# Patient Record
Sex: Female | Born: 1965
Health system: Southern US, Community
[De-identification: ages and names within clinical notes are randomized; demographics above are authoritative.]

## PROBLEM LIST (undated history)

## (undated) DIAGNOSIS — K219 Gastro-esophageal reflux disease without esophagitis: Secondary | ICD-10-CM

## (undated) DIAGNOSIS — J309 Allergic rhinitis, unspecified: Secondary | ICD-10-CM

## (undated) HISTORY — DX: Gastro-esophageal reflux disease without esophagitis: K21.9

---

## 1998-10-22 ENCOUNTER — Other Ambulatory Visit: Admission: RE | Admit: 1998-10-22 | Discharge: 1998-10-22 | Payer: Self-pay | Admitting: Obstetrics and Gynecology

## 2001-04-03 ENCOUNTER — Other Ambulatory Visit: Admission: RE | Admit: 2001-04-03 | Discharge: 2001-04-03 | Payer: Self-pay | Admitting: *Deleted

## 2001-08-22 ENCOUNTER — Encounter: Admission: RE | Admit: 2001-08-22 | Discharge: 2001-08-22 | Payer: Self-pay | Admitting: Obstetrics and Gynecology

## 2001-08-22 ENCOUNTER — Encounter: Payer: Self-pay | Admitting: Obstetrics and Gynecology

## 2002-09-30 ENCOUNTER — Other Ambulatory Visit: Admission: RE | Admit: 2002-09-30 | Discharge: 2002-09-30 | Payer: Self-pay | Admitting: Obstetrics and Gynecology

## 2006-03-28 ENCOUNTER — Encounter: Admission: RE | Admit: 2006-03-28 | Discharge: 2006-03-28 | Payer: Self-pay | Admitting: Obstetrics and Gynecology

## 2007-12-23 ENCOUNTER — Ambulatory Visit (HOSPITAL_COMMUNITY): Admission: RE | Admit: 2007-12-23 | Discharge: 2007-12-23 | Payer: Self-pay | Admitting: Obstetrics and Gynecology

## 2008-04-03 ENCOUNTER — Encounter: Admission: RE | Admit: 2008-04-03 | Discharge: 2008-04-03 | Payer: Self-pay | Admitting: Obstetrics and Gynecology

## 2010-07-03 ENCOUNTER — Encounter: Payer: Self-pay | Admitting: Obstetrics and Gynecology

## 2010-08-19 ENCOUNTER — Other Ambulatory Visit: Payer: Self-pay | Admitting: Obstetrics and Gynecology

## 2010-08-19 DIAGNOSIS — Z1231 Encounter for screening mammogram for malignant neoplasm of breast: Secondary | ICD-10-CM

## 2010-09-19 ENCOUNTER — Ambulatory Visit: Payer: Self-pay

## 2010-10-07 ENCOUNTER — Ambulatory Visit
Admission: RE | Admit: 2010-10-07 | Discharge: 2010-10-07 | Disposition: A | Discharge status: Home / Self Care | Payer: 59 | Source: Ambulatory Visit | Attending: Obstetrics and Gynecology | Admitting: Obstetrics and Gynecology

## 2010-10-07 DIAGNOSIS — Z1231 Encounter for screening mammogram for malignant neoplasm of breast: Secondary | ICD-10-CM

## 2013-06-09 ENCOUNTER — Emergency Department (HOSPITAL_COMMUNITY): Payer: 59

## 2013-06-09 ENCOUNTER — Encounter (HOSPITAL_COMMUNITY): Payer: Self-pay | Admitting: Emergency Medicine

## 2013-06-09 ENCOUNTER — Emergency Department (HOSPITAL_COMMUNITY)
Admission: EM | Admit: 2013-06-09 | Discharge: 2013-06-09 | Disposition: A | Discharge status: Home / Self Care | Payer: 59 | Attending: Emergency Medicine | Admitting: Emergency Medicine

## 2013-06-09 DIAGNOSIS — H5710 Ocular pain, unspecified eye: Secondary | ICD-10-CM | POA: Insufficient documentation

## 2013-06-09 DIAGNOSIS — R51 Headache: Secondary | ICD-10-CM | POA: Insufficient documentation

## 2013-06-09 DIAGNOSIS — Z8709 Personal history of other diseases of the respiratory system: Secondary | ICD-10-CM | POA: Insufficient documentation

## 2013-06-09 DIAGNOSIS — R1110 Vomiting, unspecified: Secondary | ICD-10-CM | POA: Insufficient documentation

## 2013-06-09 DIAGNOSIS — R071 Chest pain on breathing: Secondary | ICD-10-CM | POA: Insufficient documentation

## 2013-06-09 DIAGNOSIS — J069 Acute upper respiratory infection, unspecified: Secondary | ICD-10-CM | POA: Insufficient documentation

## 2013-06-09 DIAGNOSIS — B349 Viral infection, unspecified: Secondary | ICD-10-CM

## 2013-06-09 DIAGNOSIS — B9789 Other viral agents as the cause of diseases classified elsewhere: Secondary | ICD-10-CM | POA: Insufficient documentation

## 2013-06-09 HISTORY — DX: Allergic rhinitis, unspecified: J30.9

## 2013-06-09 LAB — RAPID STREP SCREEN (MED CTR MEBANE ONLY): Streptococcus, Group A Screen (Direct): NEGATIVE

## 2013-06-09 MED ORDER — GUAIFENESIN 100 MG/5ML PO LIQD: 100.0000 mg | ORAL | Status: DC | PRN

## 2013-06-09 MED ORDER — GUAIFENESIN 100 MG/5ML PO SOLN
5.0000 mL | Freq: Once | ORAL | Status: AC
  Administered 2013-06-09: 100 mg via ORAL
  Filled 2013-06-09 (×2): qty 5

## 2013-06-09 MED ORDER — PENICILLIN V POTASSIUM 500 MG PO TABS: 500.0000 mg | ORAL_TABLET | Freq: Two times a day (BID) | ORAL | Status: DC

## 2013-06-09 NOTE — ED Notes (Signed)
Pt states she had onset of productive cough with green sputum 2 weeks ago.  Pt has taken Amoxicillin and Tessalon pearls.  Pt states cough settled and has gotten progressively worse over the last 2 days.  Sputum is now clear.  No fever.

## 2013-06-09 NOTE — ED Provider Notes (Signed)
CSN: [id number]     Arrival date & time 06/09/13  1638 History  This chart was scribed for non-physician practitioner Raymon Mutton, PA-C, working with Doug Sou, MD, by Yevette Edwards, ED Scribe. This patient was seen in room TR07C/TR07C and the patient's care was started at 8:54 PM  First MD Initiated Contact with Patient 06/09/13 2002     Chief Complaint  Patient presents with  . Cough  . Sore Throat  . Headache   HPI HPI Comments: Christie Werner is a 47 y.o. female who presents to the Emergency Department complaining of a cough which began two weeks ago, improved, and then returned today. Her cough was productive of green sputum, and she treated her symptoms with Augmentin and tessalon pearls with improvement. She had used the tessalon pearls for 11 days with improvement until yesterday when the cough returned and increased in severity. The recurrent cough is productive of clear sputum and has been constant. She has post-tussive chest pain, increased pain with deep inspiration, one episode of emesis, eye pain, mild post-nasal drip, and a headache. She denies difficulty swallowing, neck pain, a fever, myalgia, blurred vision, otalgia, blurred vision, or hematemesis. She is a non-smoker.   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    Past Medical History  Diagnosis Date  . Allergic rhinitis    Past Surgical History  Procedure Laterality Date  . Cesarean section     No family history on file. History  Substance Use Topics  . Smoking status: Never Smoker   . Smokeless tobacco: Not on file  . Alcohol Use: No   No OB history provided.  Review of Systems  Constitutional: Negative for fever.  HENT: Positive for postnasal drip and sore throat. Negative for ear pain and trouble swallowing.   Eyes: Positive for pain. Negative for visual disturbance.  Respiratory: Positive for cough.   Cardiovascular: Positive for chest pain.  Gastrointestinal: Positive for  vomiting.  Neurological: Positive for headaches.  All other systems reviewed and are negative.   Allergies  Review of patient's allergies indicates no known allergies.  Home Medications   Current Outpatient Rx  Name  Route  Sig  Dispense  Refill  . amoxicillin-clavulanate (AUGMENTIN) 500-125 MG per tablet   Oral   Take 1 tablet by mouth 2 (two) times daily. For 14 days. Started on 05-29-13. Pt's on day 11 of therapy         . benzonatate (TESSALON) 100 MG capsule   Oral   Take 100 mg by mouth 3 (three) times daily as needed for cough.         Marland Kitchen ibuprofen (ADVIL,MOTRIN) 100 MG tablet   Oral   Take 100 mg by mouth every 6 (six) hours as needed for pain or fever.         Marland Kitchen guaiFENesin (ROBITUSSIN) 100 MG/5ML liquid   Oral   Take 5 mLs (100 mg total) by mouth every 4 (four) hours as needed for cough.   120 mL   0    Triage Vitals: BP 143/88  Pulse 105  Temp(Src) 98.2 F (36.8 C) (Oral)  Resp 15  SpO2 99%  LMP 05/10/2013  Physical Exam  Nursing note and vitals reviewed. Constitutional: She is oriented to person, place, and time. She appears well-developed and well-nourished. No distress.  HENT:  Head: Normocephalic and atraumatic.  Right Ear: External ear normal.  Left Ear: External ear normal.  Mild discomfort upon palpation to the  frontal and maxillary sinuses bilaterally Mild swelling localized to the bilateral tonsils with positive exudate mild erythema. Negative swelling, exudate, erythema, petechiae noted to the posterior oropharynx. Negative postnasal drip.  Eyes: Conjunctivae and EOM are normal. Pupils are equal, round, and reactive to light. Right eye exhibits no discharge. Left eye exhibits no discharge.  Neck: Normal range of motion. Neck supple. No tracheal deviation present.  Negative neck stiffness Negative nuchal rigidity Negative cervical lymphadenopathy  Cardiovascular: Normal rate, regular rhythm and normal heart sounds.  Exam reveals no  friction rub.   No murmur heard. Pulses:      Radial pulses are 2+ on the right side, and 2+ on the left side.  Pulmonary/Chest: Effort normal and breath sounds normal. No respiratory distress. She has no wheezes. She has no rales. She exhibits no tenderness.  Musculoskeletal: Normal range of motion. She exhibits no tenderness.  Full ROM to upper and lower extremities without difficulty noted, negative ataxia noted  Lymphadenopathy:    She has no cervical adenopathy.  Neurological: She is alert and oriented to person, place, and time. She exhibits normal muscle tone. Coordination normal.  Skin: Skin is warm and dry.  Psychiatric: She has a normal mood and affect. Her behavior is normal. Thought content normal.    ED Course  Procedures (including critical care time)  DIAGNOSTIC STUDIES: Oxygen Saturation is 99% on room air, normal by my interpretation.    COORDINATION OF CARE:  9:01 PM- Discussed treatment plan with patient, and the patient agreed to the plan.   Results for orders placed during the hospital encounter of 06/09/13  RAPID STREP SCREEN      Result Value Range   Streptococcus, Group A Screen (Direct) NEGATIVE  NEGATIVE  CULTURE, GROUP A STREP      Result Value Range   Specimen Description THROAT     Special Requests NONE     Culture       Value: No Beta Hemolytic Streptococci Isolated     Performed at [facility]   Report Status 06/11/2013 FINAL     Dg Chest 2 View  06/09/2013   CLINICAL DATA:  Cough, sore throat  EXAM: CHEST  2 VIEW  COMPARISON:  12/23/2007  FINDINGS: There is no focal parenchymal opacity, pleural effusion, or pneumothorax. The heart and mediastinal contours are unremarkable.  There is mild thoracic spine spondylosis.  IMPRESSION: No active cardiopulmonary disease.   Electronically Signed   By: Elige Ko   On: 06/09/2013 18:25    Labs Review Labs Reviewed  RAPID STREP SCREEN  CULTURE, GROUP A STREP   Imaging Review Dg Chest 2  View  06/09/2013   CLINICAL DATA:  Cough, sore throat  EXAM: CHEST  2 VIEW  COMPARISON:  12/23/2007  FINDINGS: There is no focal parenchymal opacity, pleural effusion, or pneumothorax. The heart and mediastinal contours are unremarkable.  There is mild thoracic spine spondylosis.  IMPRESSION: No active cardiopulmonary disease.   Electronically Signed   By: Elige Ko   On: 06/09/2013 18:25    EKG Interpretation   None       MDM   1. URI, acute   2. Viral illness    Medications  guaiFENesin (ROBITUSSIN) 100 MG/5ML solution 100 mg (100 mg Oral Given 06/09/13 2154)    Filed Vitals:   06/09/13 1652  BP: 143/88  Pulse: 105  Temp: 98.2 F (36.8 C)  TempSrc: Oral  Resp: 15  SpO2: 99%   I personally performed  the services described in this documentation, which was scribed in my presence. The recorded information has been reviewed and is accurate.  Patient presenting to emergency department with nasal congestion, productive cough, sinus pressure that is been ongoing for the past 2 weeks. Patient reports that she prescribed for self Augmentin and Tessalon Perles. Patient reported within the past 48 hours she is worsening cough, more dry than productive. Reported that she is on a 14 day course of Augmenting is on her ninth day.  Alert and oriented. GCS 15. Heart rate and rhythm normal. Pulses palpable strong, radial 2+ bilaterally. Lungs clear to auscultation bilaterally. Mild discomfort upon palpation to the frontal and maxillary sinuses bilaterally. Negative postnasal drip. Exudate noted to tonsils bilaterally with mild erythema. Negative erythema, swelling, petechiae or exudate noted to the posterior oropharynx. Negative trismus. Uvula midline, symmetrical elevation-negative for uvula swelling. Negative neck stiffness, negative nuchal rigidity. Full range of motion to upper and lower extremities bilaterally. Chest x-ray negative for acute cardiac pulmonary disease. Rapid strep test  negative. Doubt strep pharyngitis. Doubt meningitis. Doubt pneumonia. Suspicion to be viral illness. Patient stable, afebrile. Discharged patient. Discussed with patient to continue to take Augmentin as prescribed. Discussed with patient to take Robitussin. Referred to PCP. Discussed with patient to closely monitor symptoms and if symptoms are to worsen or change to report back to the ED - strict return instructions given.  Patient agreed to plan of care, understood, all questions answered.    Raymon Mutton, PA-C 06/11/13 1312

## 2013-06-11 LAB — CULTURE, GROUP A STREP

## 2013-06-11 NOTE — ED Provider Notes (Signed)
Medical screening examination/treatment/procedure(s) were performed by non-physician practitioner and as supervising physician I was immediately available for consultation/collaboration.  EKG Interpretation   None        Doug Sou, MD 06/11/13 2034

## 2013-06-27 ENCOUNTER — Ambulatory Visit (INDEPENDENT_AMBULATORY_CARE_PROVIDER_SITE_OTHER): Payer: 59 | Admitting: Emergency Medicine

## 2013-06-27 ENCOUNTER — Ambulatory Visit: Payer: 59

## 2013-06-27 VITALS — BP 118/74 | HR 92 | Temp 99.2°F | Resp 18 | Ht 64.5 in | Wt 173.0 lb

## 2013-06-27 DIAGNOSIS — R059 Cough, unspecified: Secondary | ICD-10-CM

## 2013-06-27 DIAGNOSIS — J209 Acute bronchitis, unspecified: Secondary | ICD-10-CM

## 2013-06-27 DIAGNOSIS — J029 Acute pharyngitis, unspecified: Secondary | ICD-10-CM

## 2013-06-27 DIAGNOSIS — R05 Cough: Secondary | ICD-10-CM

## 2013-06-27 DIAGNOSIS — J040 Acute laryngitis: Secondary | ICD-10-CM

## 2013-06-27 LAB — POCT RAPID STREP A (OFFICE): RAPID STREP A SCREEN: NEGATIVE

## 2013-06-27 MED ORDER — ALBUTEROL SULFATE HFA 108 (90 BASE) MCG/ACT IN AERS: 2.0000 | INHALATION_SPRAY | RESPIRATORY_TRACT | Status: DC | PRN

## 2013-06-27 MED ORDER — HYDROCOD POLST-CHLORPHEN POLST 10-8 MG/5ML PO LQCR: 5.0000 mL | Freq: Two times a day (BID) | ORAL | Status: DC | PRN

## 2013-06-27 NOTE — Patient Instructions (Signed)

## 2013-06-27 NOTE — Progress Notes (Signed)
[facility] [address], [place] [address] [phone]  Date:  06/27/2013   Name:  Christie Werner   DOB:  29-Jan-1966   MRN:  [id number]  PCP:  Alva Garnet., MD    Chief Complaint: Cough, Laryngitis and Sore Throat   History of Present Illness:  Christie Werner is a 48 y.o. very pleasant female patient who presents with the following:  Lengthy history of illness for three weeks.  Initially had sinusitis and bronchitis, treated with augmentin.  She developed the flu on her last day of augmentin.  following four days of fever, she developed bronchospasm and started neb aerosols of albuterol with improvement both of her cough and wheezing.  she was seen at [facility] ER and discharged with a URI during this time.  Since her ER visit.  Since her recovery from the flu symptoms she has experienced a persistent cough that is not productive, a sore throat and a mucoid nasal discharge and post nasal drip and laryngitis.  She has no residual fever or chills or malaise.  No improvement with over the counter medications or other home remedies. Denies other complaint or health concern today.   There are no active problems to display for this patient.   Past Medical History  Diagnosis Date  . Allergic rhinitis   . GERD (gastroesophageal reflux disease)     Past Surgical History  Procedure Laterality Date  . Cesarean section      History  Substance Use Topics  . Smoking status: Never Smoker   . Smokeless tobacco: Not on file  . Alcohol Use: No    Family History  Problem Relation Age of Onset  . Diabetes Mother   . Hyperlipidemia Mother   . Hypertension Father   . Mental illness Sister   . Diabetes Maternal Grandmother   . Stroke Maternal Grandmother   . Diabetes Maternal Grandfather   . Diabetes Sister     No Known Allergies  Medication list has been reviewed and updated.  Current Outpatient Prescriptions on File Prior to Visit  Medication Sig Dispense  Refill  . amoxicillin-clavulanate (AUGMENTIN) 500-125 MG per tablet Take 1 tablet by mouth 2 (two) times daily. For 14 days. Started on 05-29-13. Pt's on day 11 of therapy      . benzonatate (TESSALON) 100 MG capsule Take 100 mg by mouth 3 (three) times daily as needed for cough.      Marland Kitchen guaiFENesin (ROBITUSSIN) 100 MG/5ML liquid Take 5 mLs (100 mg total) by mouth every 4 (four) hours as needed for cough.  120 mL  0  . ibuprofen (ADVIL,MOTRIN) 100 MG tablet Take 100 mg by mouth every 6 (six) hours as needed for pain or fever.       No current facility-administered medications on file prior to visit.    Review of Systems:  As per HPI, otherwise negative.   Physical Examination: Filed Vitals:   06/27/13 1259  BP: 118/74  Pulse: 92  Temp: 99.2 F (37.3 C)  Resp: 18   Filed Vitals:   06/27/13 1259  Height: 5' 4.5" (1.638 m)  Weight: 173 lb (78.472 kg)   Body mass index is 29.25 kg/(m^2). Ideal Body Weight: Weight in (lb) to have BMI = 25: 147.6  GEN: WDWN, NAD, Non-toxic, A & O x 3 hoarse. HEENT: Atraumatic, Normocephalic. Neck supple. No masses, No LAD. Ears and Nose: No external deformity. CV: RRR, No M/G/R. No JVD. No thrill. No extra heart  sounds. PULM: CTA B, no wheezes, crackles, rhonchi. No retractions. No resp. distress. No accessory muscle use. ABD: S, NT, ND, +BS. No rebound. No HSM. EXTR: No c/c/e NEURO Normal gait.  PSYCH: Normally interactive. Conversant. Not depressed or anxious appearing.  Calm demeanor.    Assessment and Plan: Laryngitis Bronchitis Albuterol MDI tussionex  Signed,  Phillips OdorJeffery Anderson, MD   Results for orders placed in visit on 06/27/13  POCT RAPID STREP A (OFFICE)      Result Value Range   Rapid Strep A Screen Negative  Negative   [facility] reading (PRIMARY) by  Dr. Dareen PianoAnderson.  Negative chest..

## 2013-07-07 LAB — CULTURE, GROUP A STREP: ORGANISM ID, BACTERIA: NORMAL

## 2013-10-06 ENCOUNTER — Ambulatory Visit (INDEPENDENT_AMBULATORY_CARE_PROVIDER_SITE_OTHER): Payer: 59 | Admitting: Family Medicine

## 2013-10-06 ENCOUNTER — Encounter: Payer: Self-pay | Admitting: Family Medicine

## 2013-10-06 VITALS — BP 112/80 | HR 71 | Temp 98.7°F | Resp 16 | Ht 64.5 in | Wt 172.0 lb

## 2013-10-06 DIAGNOSIS — Z7689 Persons encountering health services in other specified circumstances: Secondary | ICD-10-CM

## 2013-10-06 DIAGNOSIS — Z7189 Other specified counseling: Secondary | ICD-10-CM

## 2013-10-06 DIAGNOSIS — J309 Allergic rhinitis, unspecified: Secondary | ICD-10-CM

## 2013-10-06 NOTE — Patient Instructions (Signed)
Good to see you today! Come and see me this fall for a physical exam

## 2013-10-06 NOTE — Progress Notes (Signed)
[facility] [address], [place] [address] [phone]  Date:  10/06/2013   Name:  Christie Werner   DOB:  08/29/1965   MRN:  [id number]  PCP:  Alva GarnetSHELTON,KIMBERLY R., MD    Chief Complaint: Establish Care   History of Present Illness:  Christie Stacknger Delong is a 48 y.o. very pleasant female patient who presents with the following:  She is here to establish care today.  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   She is generally healthy.   She is "finally" done with wheezing.  She has some occasional sneezing, but nothing major.  Sometimes her ears will pop.  She uses an OTC allergy med sometimes but not always.  Does not desire a nasal spray.  She has occasional reflux with dietary indiscretion.   She still has menses.  She has one son- he is 49 years old.    There are no active problems to display for this patient.   Past Medical History  Diagnosis Date  . Allergic rhinitis   . GERD (gastroesophageal reflux disease)     Past Surgical History  Procedure Laterality Date  . Cesarean section      History  Substance Use Topics  . Smoking status: Never Smoker   . Smokeless tobacco: Not on file  . Alcohol Use: No    Family History  Problem Relation Age of Onset  . Diabetes Mother   . Hyperlipidemia Mother   . Hypertension Father   . Mental illness Sister   . Diabetes Maternal Grandmother   . Stroke Maternal Grandmother   . Diabetes Maternal Grandfather   . Diabetes Sister     No Known Allergies  Medication list has been reviewed and updated.  Current Outpatient Prescriptions on File Prior to Visit  Medication Sig Dispense Refill  . albuterol (PROVENTIL HFA;VENTOLIN HFA) 108 (90 BASE) MCG/ACT inhaler Inhale 2 puffs into the lungs every 4 (four) hours as needed for wheezing or shortness of breath (cough, shortness of breath or wheezing.).  1 Inhaler  1  . beta carotene w/minerals (OCUVITE) tablet Take 1 tablet by mouth daily.      . Calcium  Carbonate (CALCIUM 600 PO) Take by mouth.      . Famotidine (PEPCID PO) Take by mouth.      . Multiple Vitamins-Minerals (CENTRUM PO) Take by mouth.       No current facility-administered medications on file prior to visit.    Review of Systems:  As per HPI- otherwise negative.   Physical Examination: Filed Vitals:   10/06/13 0941  BP: 138/88  Pulse: 71  Temp: 98.7 F (37.1 C)  Resp: 16   Filed Vitals:   10/06/13 0941  Height: 5' 4.5" (1.638 m)  Weight: 172 lb (78.019 kg)   Body mass index is 29.08 kg/(m^2). Ideal Body Weight: Weight in (lb) to have BMI = 25: 147.6  GEN: WDWN, NAD, Non-toxic, A & O x 3, overweight, looks well HEENT: Atraumatic, Normocephalic. Neck supple. No masses, No LAD.  Bilateral TM wnl, oropharynx normal.  PEERL,EOMI.   Ears and Nose: No external deformity. CV: RRR, No M/G/R. No JVD. No thrill. No extra heart sounds. PULM: CTA B, no wheezes, crackles, rhonchi. No retractions. No resp. distress. No accessory muscle use. EXTR: No c/c/e NEURO Normal gait.  PSYCH: Normally interactive. Conversant. Not depressed or anxious appearing.  Calm demeanor.    Assessment and Plan: Encounter to establish care  Healthy woman  here to establish care today.  Plan labs and a physical in the fall.  She is not fasting today so prefers not to do labs today  Signed Abbe AmsterdamJessica Ignatz Deis, MD

## 2013-12-19 ENCOUNTER — Ambulatory Visit (INDEPENDENT_AMBULATORY_CARE_PROVIDER_SITE_OTHER): Payer: 59 | Admitting: Emergency Medicine

## 2013-12-19 VITALS — BP 126/82 | HR 82 | Temp 98.2°F | Resp 16 | Ht 62.25 in | Wt 172.6 lb

## 2013-12-19 DIAGNOSIS — H8110 Benign paroxysmal vertigo, unspecified ear: Secondary | ICD-10-CM

## 2013-12-19 MED ORDER — MECLIZINE HCL 50 MG PO TABS: 50.0000 mg | ORAL_TABLET | Freq: Three times a day (TID) | ORAL | Status: DC | PRN

## 2013-12-19 NOTE — Progress Notes (Signed)
[facility] [address], [place] [address] [phone]  Date:  12/19/2013   Name:  Christie Werner   DOB:  07/27/1965   MRN:  [id number]  PCP:  Alva GarnetSHELTON,KIMBERLY R., MD    Chief Complaint: Dizziness, Emesis and Nausea   History of Present Illness:  Christie Stacknger Giuliani is a 48 y.o. very pleasant female patient who presents with the following:  Sudden onset of dizziness and intense nausea with one episode of vomiting.  No history of antecedent illness or head injury.  No history of palpitations or tachycardia. No chest pain, tightness or heaviness.  No neuro or visual symptoms.  Says dizziness is related to movement of her head.  No history of vertigo.  Non smoker, no HBP, HLD, DM.    There are no active problems to display for this patient.   Past Medical History  Diagnosis Date  . Allergic rhinitis   . GERD (gastroesophageal reflux disease)     Past Surgical History  Procedure Laterality Date  . Cesarean section      History  Substance Use Topics  . Smoking status: Never Smoker   . Smokeless tobacco: Not on file  . Alcohol Use: No    Family History  Problem Relation Age of Onset  . Diabetes Mother   . Hyperlipidemia Mother   . Hypertension Father   . Mental illness Sister   . Diabetes Maternal Grandmother   . Stroke Maternal Grandmother   . Diabetes Maternal Grandfather   . Diabetes Sister     No Known Allergies  Medication list has been reviewed and updated.  Current Outpatient Prescriptions on File Prior to Visit  Medication Sig Dispense Refill  . albuterol (PROVENTIL HFA;VENTOLIN HFA) 108 (90 BASE) MCG/ACT inhaler Inhale 2 puffs into the lungs every 4 (four) hours as needed for wheezing or shortness of breath (cough, shortness of breath or wheezing.).  1 Inhaler  1  . beta carotene w/minerals (OCUVITE) tablet Take 1 tablet by mouth daily.      . Calcium Carbonate (CALCIUM 600 PO) Take by mouth.      . Famotidine (PEPCID PO) Take by mouth.       . Multiple Vitamins-Minerals (CENTRUM PO) Take by mouth.       No current facility-administered medications on file prior to visit.    Review of Systems:  As per HPI, otherwise negative.    Physical Examination: Filed Vitals:   12/19/13 1557  BP: 126/82  Pulse: 82  Temp: 98.2 F (36.8 C)  Resp: 16   Filed Vitals:   12/19/13 1557  Height: 5' 2.25" (1.581 m)  Weight: 172 lb 9.6 oz (78.291 kg)   Body mass index is 31.32 kg/(m^2). Ideal Body Weight: Weight in (lb) to have BMI = 25: 137.5  GEN: WDWN, NAD, Non-toxic, A & O x 3 HEENT: Atraumatic, Normocephalic. Neck supple. No masses, No LAD. Ears and Nose: No external deformity. CV: RRR, No M/G/R. No JVD. No thrill. No extra heart sounds.  No bruit PULM: CTA B, no wheezes, crackles, rhonchi. No retractions. No resp. distress. No accessory muscle use. ABD: S, NT, ND, +BS. No rebound. No HSM. EXTR: No c/c/e NEURO Normal gait. PRRERLA EOMI CN 2-12 intact PSYCH: Normally interactive. Conversant. Not depressed or anxious appearing.  Calm demeanor.     Assessment and Plan: Benign positional vertigo antivert  ENT    Signed,  Phillips OdorJeffery Helayne Metsker, MD

## 2013-12-19 NOTE — Patient Instructions (Signed)
Benign Positional Vertigo Vertigo means you feel like you or your surroundings are moving when they are not. Benign positional vertigo is the most common form of vertigo. Benign means that the cause of your condition is not serious. Benign positional vertigo is more common in older adults. CAUSES  Benign positional vertigo is the result of an upset in the labyrinth system. This is an area in the middle ear that helps control your balance. This may be caused by a viral infection, head injury, or repetitive motion. However, often no specific cause is found. SYMPTOMS  Symptoms of benign positional vertigo occur when you move your head or eyes in different directions. Some of the symptoms may include:  Loss of balance and falls.  Vomiting.  Blurred vision.  Dizziness.  Nausea.  Involuntary eye movements (nystagmus). DIAGNOSIS  Benign positional vertigo is usually diagnosed by physical exam. If the specific cause of your benign positional vertigo is unknown, your caregiver may perform imaging tests, such as magnetic resonance imaging (MRI) or computed tomography (CT). TREATMENT  Your caregiver may recommend movements or procedures to correct the benign positional vertigo. Medicines such as meclizine, benzodiazepines, and medicines for nausea may be used to treat your symptoms. In rare cases, if your symptoms are caused by certain conditions that affect the inner ear, you may need surgery. HOME CARE INSTRUCTIONS   Follow your caregiver's instructions.  Move slowly. Do not make sudden body or head movements.  Avoid driving.  Avoid operating heavy machinery.  Avoid performing any tasks that would be dangerous to you or others during a vertigo episode.  Drink enough fluids to keep your urine clear or pale yellow. SEEK IMMEDIATE MEDICAL CARE IF:   You develop problems with walking, weakness, numbness, or using your arms, hands, or legs.  You have difficulty speaking.  You develop  severe headaches.  Your nausea or vomiting continues or gets worse.  You develop visual changes.  Your family or friends notice any behavioral changes.  Your condition gets worse.  You have a fever.  You develop a stiff neck or sensitivity to light. MAKE SURE YOU:   Understand these instructions.  Will watch your condition.  Will get help right away if you are not doing well or get worse. Document Released: 03/06/2006 Document Revised: 08/21/2011 Document Reviewed: 02/16/2011 Exit[employer] Patient Information 2015 [employer]. This information is not intended to replace advice given to you by your health care provider. Make sure you discuss any questions you have with your health care provider.    

## 2014-02-23 ENCOUNTER — Ambulatory Visit (INDEPENDENT_AMBULATORY_CARE_PROVIDER_SITE_OTHER): Payer: 59 | Admitting: Family Medicine

## 2014-02-23 ENCOUNTER — Encounter: Payer: Self-pay | Admitting: Family Medicine

## 2014-02-23 VITALS — BP 123/80 | Ht 64.0 in | Wt 175.0 lb

## 2014-02-23 DIAGNOSIS — M25519 Pain in unspecified shoulder: Secondary | ICD-10-CM

## 2014-02-23 DIAGNOSIS — M25511 Pain in right shoulder: Secondary | ICD-10-CM

## 2014-02-24 DIAGNOSIS — M25519 Pain in unspecified shoulder: Secondary | ICD-10-CM | POA: Insufficient documentation

## 2014-02-24 NOTE — Assessment & Plan Note (Signed)
Pain seems to be mostly of the deltoid so I suspect it's referred from either subacromial bursa or referred nerve pain. Can't really find a specific etiology. We'll start her on general shoulder rehabilitation program which we gave her in handout form explained. She'll followup in 2-4 weeks. We discussed possibly doing a corticosteroid injection she does not want to consider that today but my considered in the future. She recalls in the interim with new or worsening symptoms.

## 2014-02-24 NOTE — Progress Notes (Signed)
Patient ID: Christie Werner, female   DOB: Oct 10, 1965, 48 y.o.   MRN: [id number]  Christie Werner - 48 y.o. female MRN [id number]  Date of birth: 21-Mar-1966    SUBJECTIVE:     Right shoulder pain for the last 3-4 weeks. Pain is located under the deltoid muscle. Extremely painful at night when she tries to while met side. Also has pain with arm elevation. She is right-hand dominant. She recalls no specific injury, just awoke one morning with pain there. No news activities. ROS:     No numbness or tingling in her right upper extremity. As noted no warmth or erythema or swelling of the right shoulder joint. No fever, sweats, chills.  PERTINENT  PMH / PSH FH / / SH:  Past Medical, Surgical, Social, and Family History Reviewed & Updated in the EMR.  Pertinent findings include:  No prior history of shoulder surgery or injury.  OBJECTIVE: BP 123/80  Ht _0  (1.626 m)  Wt 175 lb (79.379 kg)  BMI 30.02 kg/m2  Physical Exam:  Vital signs are reviewed. GENERAL: Well-developed female no acute distress Shoulder: Right. Full range of motion in all planes the rotator cuff. Shoulder abduction causes some increased pain in the area of the deltoid muscle. She does not really have any significant impingement signs. Distally she is neurovascularly intact. The muscle bulk of the right and left deltoid is symmetrical. The a.c. joint is non-tender to palpation. ULTRASOUND: Supraspinatus and subscapularis muscle are seen in entirety and reveal no calcifications, no tears, no edema, no increased Doppler activity. The a.c. joint is seen and appears normal without any sign of excessive arthritic change, no effusion. The glenoid/labrum is not seen well secondary to habitus.  ASSESSMENT & PLAN:  See problem based charting & AVS for pt instructions.

## 2014-03-13 ENCOUNTER — Ambulatory Visit: Payer: 59 | Admitting: Family Medicine

## 2015-08-06 DIAGNOSIS — H524 Presbyopia: Secondary | ICD-10-CM | POA: Diagnosis not present

## 2017-01-11 DIAGNOSIS — H5211 Myopia, right eye: Secondary | ICD-10-CM | POA: Diagnosis not present

## 2017-01-11 DIAGNOSIS — H52203 Unspecified astigmatism, bilateral: Secondary | ICD-10-CM | POA: Diagnosis not present

## 2017-01-11 DIAGNOSIS — H5202 Hypermetropia, left eye: Secondary | ICD-10-CM | POA: Diagnosis not present

## 2017-01-11 DIAGNOSIS — H524 Presbyopia: Secondary | ICD-10-CM | POA: Diagnosis not present

## 2018-01-21 MED FILL — CEPHALEXIN 500 MG CAPSULE: 500 | 10 days supply | Qty: 20 | Fill #0

## 2018-01-21 MED FILL — FLUCONAZOLE 150 MG TABS: 150 | 1 days supply | Qty: 1 | Fill #0

## 2018-05-07 DIAGNOSIS — H524 Presbyopia: Secondary | ICD-10-CM | POA: Diagnosis not present

## 2018-05-07 DIAGNOSIS — H5202 Hypermetropia, left eye: Secondary | ICD-10-CM | POA: Diagnosis not present

## 2018-05-07 DIAGNOSIS — H5211 Myopia, right eye: Secondary | ICD-10-CM | POA: Diagnosis not present

## 2018-07-14 DIAGNOSIS — J189 Pneumonia, unspecified organism: Secondary | ICD-10-CM | POA: Diagnosis not present

## 2018-07-14 DIAGNOSIS — J101 Influenza due to other identified influenza virus with other respiratory manifestations: Secondary | ICD-10-CM | POA: Diagnosis not present

## 2019-04-09 ENCOUNTER — Other Ambulatory Visit: Payer: Self-pay

## 2019-04-09 ENCOUNTER — Other Ambulatory Visit: Payer: Self-pay | Admitting: Obstetrics and Gynecology

## 2019-04-09 ENCOUNTER — Encounter: Payer: Self-pay | Admitting: Sports Medicine

## 2019-04-09 ENCOUNTER — Ambulatory Visit: Payer: 59 | Admitting: Sports Medicine

## 2019-04-09 VITALS — BP 128/92 | Ht 64.0 in | Wt 175.0 lb

## 2019-04-09 DIAGNOSIS — M25511 Pain in right shoulder: Secondary | ICD-10-CM | POA: Diagnosis not present

## 2019-04-09 DIAGNOSIS — Z1231 Encounter for screening mammogram for malignant neoplasm of breast: Secondary | ICD-10-CM

## 2019-04-09 MED ORDER — METHYLPREDNISOLONE ACETATE 40 MG/ML IJ SUSP
40.0000 mg | Freq: Once | INTRAMUSCULAR | Status: AC
  Administered 2019-04-09: 11:00:00 40 mg via INTRA_ARTICULAR

## 2019-04-09 NOTE — Progress Notes (Addendum)
   [facility] [address] 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  She describes the pain is intermittent but has progressively gotten worse with overhead activity and reaching forward.  She had a similar injury about 5 years ago.  Will occasionally have clicking in the anterior shoulder.  Was no specific mechanism.  Over the last few weeks she has noted some numbness and weakness in her thumb and index finger.  She has had to use OTC anti-inflammatory 5-6 times for pain relief.  Has not tried HEP or PT.  She denies swelling, erythema, instability, or skin changes.     ROS:     See HPI.   PERTINENT  PMH / PSH / FH / SH:  Past Medical, Surgical, Social, and Family History Reviewed & Updated in the EMR. Pertinent findings include:  History of neck pain   OBJECTIVE:  BP (!) 128/92   Ht 5\' 4"  (1.626 m)   Wt 175 lb (79.4 kg)   BMI 30.04 kg/m  Physical Exam:  Vital signs are reviewed.   GEN: Alert and oriented, NAD Pulm: Breathing unlabored PSY: normal mood, congruent affect  MSK: Right shoulder Well developed, well nourished, in no acute distress. No swelling, ecchymoses.  No gross deformity. No TTP. Lacking last 10 degrees of abduction with painful arc Positive Neer's Negative Yergasons. Strength 4/5 with empty can and resisted internal/external rotation. Positive apprehension. NV intact distally.  Left shoulder Well developed, well nourished, in no acute distress. No swelling, ecchymoses.  No gross deformity. No TTP. FROM. Negative Hawkins, Neers. Negative Yergasons. Strength 5/5 with empty can and resisted  internal/external rotation. Negative apprehension. NV intact distally.  Limited MSK Ultrasound: Right shoulder No evidence of joint effusion.   The biceps brachii long head tendon demonstrates hypoechoic changes in the bicipital groove without tear, tenosynovitis, or subluxation/dislocation in short and long axis view. Supraspinatus, infraspinatus, subscapularis, and teres minor tendons visualized without abnormality Effusion of subacromial bursa Posterior labrum is unremarkable  Impression: Biceps tendinosis with subacromial bursitis  Procedure: After informed written consent timeout was performed, patient was seated. Right shoulder was prepped with alcohol swab and utilizing posterior approach, patient's right subacromial space was injected with 3:1 lidocaine: depomedrol. Patient tolerated the procedure well without immediate complications.   ASSESSMENT & PLAN:   1. Right shoulder pain  Given the physical exam and US findings there is concern for subluxation of the biceps tendon and subacromial bursitis.  I am hopeful that the injection today will help with pain and inflammation.  She was given a HEP for rotator cuff pathology to work on stability of the shoulder joint.  She will follow-up in 1 month at which time we can repeat the scan, send to formal physical therapy, or if no improvement at all consider formal imaging.  He can use OTC anti-inflammatory as needed.  Like to perform the exam again in a month to better rule out adhesive capsulitis.  Lanier Clam, DO, ATC Sports Medicine Fellow  Addendum:  Patient seen in the office by fellow.  His history, exam, plan of care were precepted with me.  Karlton Lemon MD Kirt Boys

## 2019-04-09 NOTE — Addendum Note (Signed)
Addended by: Jolinda Croak E on: 04/09/2019 10:45 AM   Modules accepted: Orders

## 2019-05-13 DIAGNOSIS — H5202 Hypermetropia, left eye: Secondary | ICD-10-CM | POA: Diagnosis not present

## 2019-05-13 DIAGNOSIS — H524 Presbyopia: Secondary | ICD-10-CM | POA: Diagnosis not present

## 2019-05-13 DIAGNOSIS — H5211 Myopia, right eye: Secondary | ICD-10-CM | POA: Diagnosis not present

## 2019-05-14 ENCOUNTER — Ambulatory Visit: Payer: 59 | Admitting: Sports Medicine

## 2019-05-21 ENCOUNTER — Other Ambulatory Visit: Payer: Self-pay

## 2019-05-21 ENCOUNTER — Encounter: Payer: Self-pay | Admitting: Sports Medicine

## 2019-05-21 ENCOUNTER — Ambulatory Visit (INDEPENDENT_AMBULATORY_CARE_PROVIDER_SITE_OTHER): Payer: 59 | Admitting: Sports Medicine

## 2019-05-21 VITALS — BP 132/80 | Ht 64.0 in | Wt 180.0 lb

## 2019-05-21 DIAGNOSIS — M25511 Pain in right shoulder: Secondary | ICD-10-CM

## 2019-05-21 NOTE — Progress Notes (Addendum)
[facility] [address] 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  6 weeks ago we performed a subacromial bursitis CSI and started on an HEP.  She has noted almost 100% improvement over that time.  She is also found she is moving more slowly and thinking about the movements that hurt her before attempting them.  She does have intermittent numbness over her right thumb, but does not stop her from working or daily activities.  Past Medical History:  Diagnosis Date  . Allergic rhinitis   . GERD (gastroesophageal reflux disease)     No current outpatient medications on file prior to visit.   No current facility-administered medications on file prior to visit.     Past Surgical History:  Procedure Laterality Date  . CESAREAN SECTION      No Known Allergies  Social History   Socioeconomic History  . Marital status: Married    Spouse name: Not on file  . Number of children: Not on file  . Years of education: Not on file  . Highest education level: Not on file  Occupational History  . Not on file  Social Needs  . Financial resource strain: Not on file  . Food insecurity    Worry: Not on file    Inability: Not on file  . Transportation needs    Medical: Not on file    Non-medical: Not on file  Tobacco Use  . Smoking status: Never Smoker  Substance and Sexual Activity  . Alcohol use: No  . Drug use: No  . Sexual activity: Not on file  Lifestyle  . Physical activity    Days per week: Not on file    Minutes per session: Not on file  . Stress: Not on file  Relationships  . Social Herbalist on phone: Not on file    Gets together: Not on file    Attends religious service: Not on file   Active member of club or organization: Not on file    Attends meetings of clubs or organizations: Not on file    Relationship status: Not on file  . Intimate partner violence    Fear of current or ex partner: Not on file    Emotionally abused: Not on file    Physically abused: Not on file    Forced sexual activity: Not on file  Other Topics Concern  . Not on file  Social History Narrative  . Not on file    Family History  Problem Relation Age of Onset  . Diabetes Mother   . Hyperlipidemia Mother   . Hypertension Father   . Mental illness Sister   . Diabetes Maternal Grandmother   . Stroke Maternal Grandmother   . Diabetes Maternal Grandfather   . Diabetes Sister     BP 132/80   Ht 5\' 4"  (1.626 m)   Wt 180 lb (81.6 kg)   BMI 30.90 kg/m   ROS:  See HPI CONST: no F/C, no malaise, no fatigue MSK: See above NEURO: no numbness/tingling, no weakness SKIN: no rash, no lesions HEME: no bleeding, no bruising, no erythema  Objective: GEN: Alert and oriented, NAD Pulm: Breathing unlabored PSY: normal mood, congruent affect  Right shoulder Well developed, well  nourished, in no acute distress. No swelling, ecchymoses.  No gross deformity. No TTP. FROM. Strength 4+/5 with empty can and resisted internal/external rotation. Negative Hawkins, Neers. Negative Yergasons. Negative apprehension. NV intact distally. + Spurling's  Assessment and Plan:  1.  Right shoulder bursitis  While I do think patient likely has some cervical osteoarthritis, I think given the vast improvement in her symptoms with home therapy, we will continue this course in the hopes of giving her some relief in her neck.  If she is still having the numbness in her thumb and her shoulder is not reaching maximal improvement, we can consider further diagnostic work-up at that time.   Judge Stall, DO, ATC Sports Medicine Fellow  Addendum:  Patient seen in the office by fellow.  His history, exam, plan  of care were precepted with me.  Norton Blizzard MD [name]

## 2019-06-02 ENCOUNTER — Ambulatory Visit
Admission: RE | Admit: 2019-06-02 | Discharge: 2019-06-02 | Disposition: A | Discharge status: Home / Self Care | Payer: 59 | Source: Ambulatory Visit | Attending: Obstetrics and Gynecology | Admitting: Obstetrics and Gynecology

## 2019-06-02 ENCOUNTER — Other Ambulatory Visit: Payer: Self-pay

## 2019-06-02 DIAGNOSIS — Z1231 Encounter for screening mammogram for malignant neoplasm of breast: Secondary | ICD-10-CM | POA: Diagnosis not present

## 2019-09-02 DIAGNOSIS — Z1151 Encounter for screening for human papillomavirus (HPV): Secondary | ICD-10-CM | POA: Diagnosis not present

## 2019-09-02 DIAGNOSIS — Z01419 Encounter for gynecological examination (general) (routine) without abnormal findings: Secondary | ICD-10-CM | POA: Diagnosis not present

## 2019-09-02 DIAGNOSIS — Z6831 Body mass index (BMI) 31.0-31.9, adult: Secondary | ICD-10-CM | POA: Diagnosis not present

## 2019-10-06 DIAGNOSIS — Z1212 Encounter for screening for malignant neoplasm of rectum: Secondary | ICD-10-CM | POA: Diagnosis not present

## 2019-10-06 DIAGNOSIS — Z1211 Encounter for screening for malignant neoplasm of colon: Secondary | ICD-10-CM | POA: Diagnosis not present

## 2021-01-23 IMAGING — MG DIGITAL SCREENING BILAT W/ CAD
5 series · 5 of 5 positions shown · non-contrast
Comparison: Previous exam(s).

CLINICAL DATA: Screening.

EXAM:
DIGITAL SCREENING BILATERAL MAMMOGRAM WITH CAD

[L MLO]
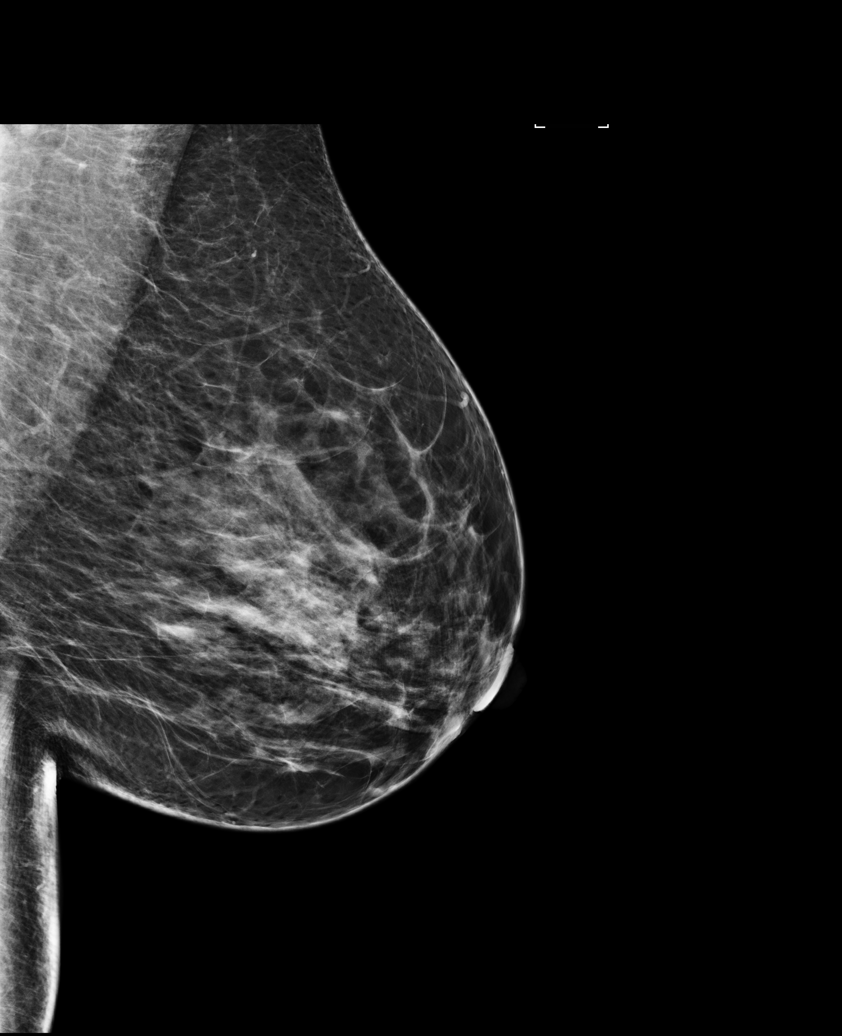

[R CC (1 of 2)]
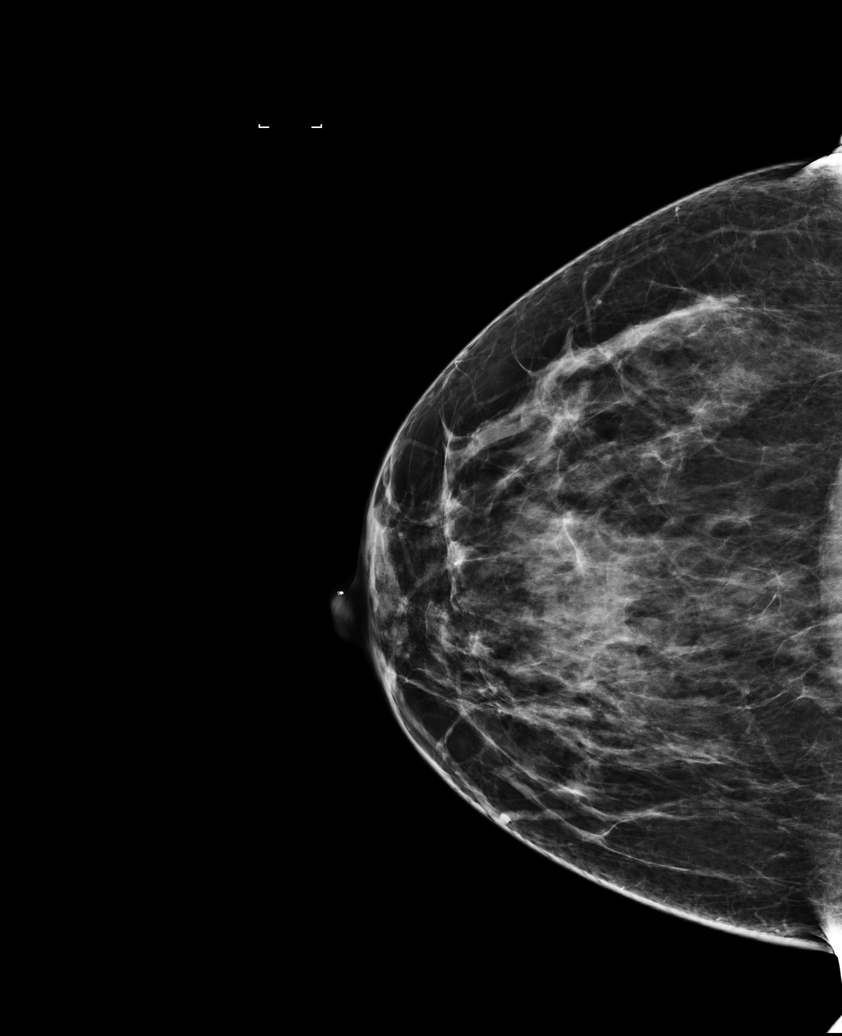

[R MLO]
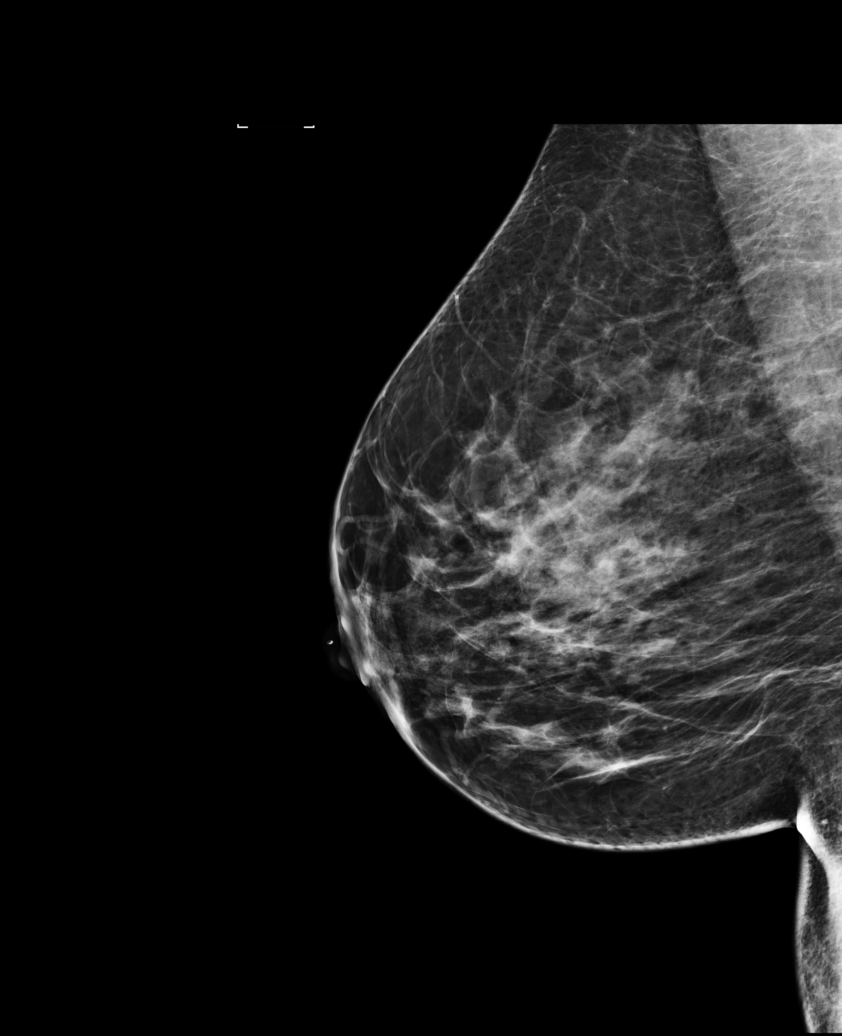

[R CC (2 of 2)]
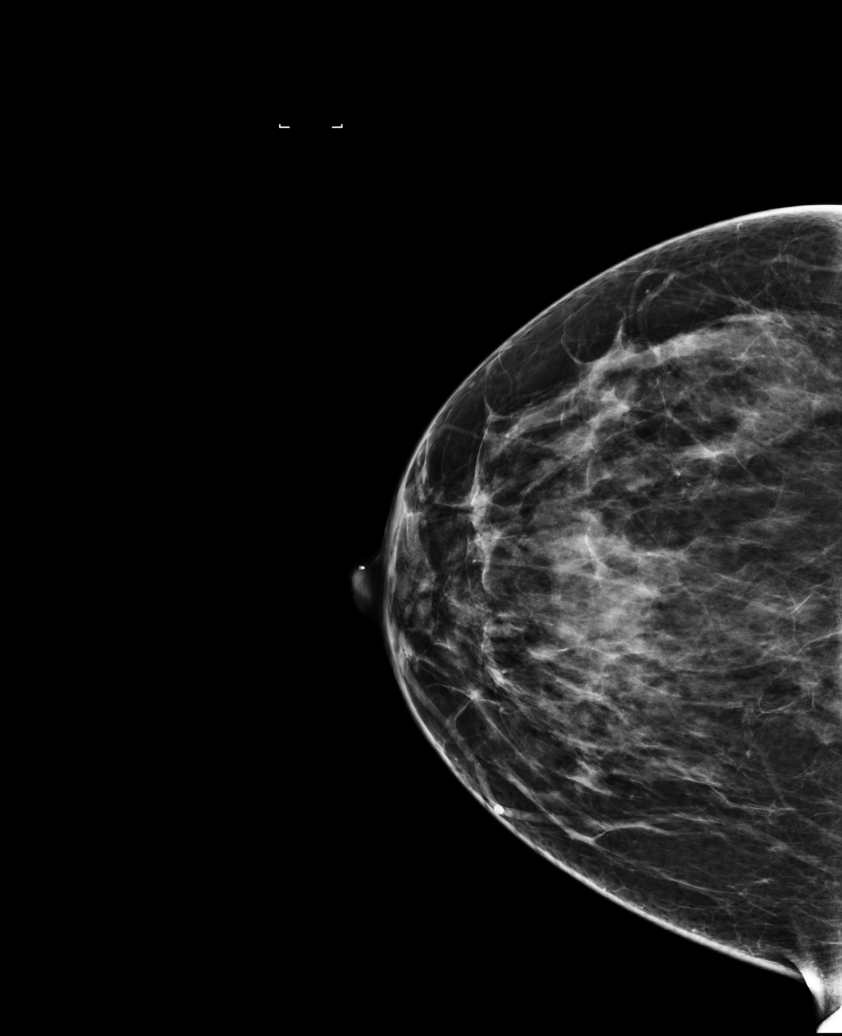

[L CC]
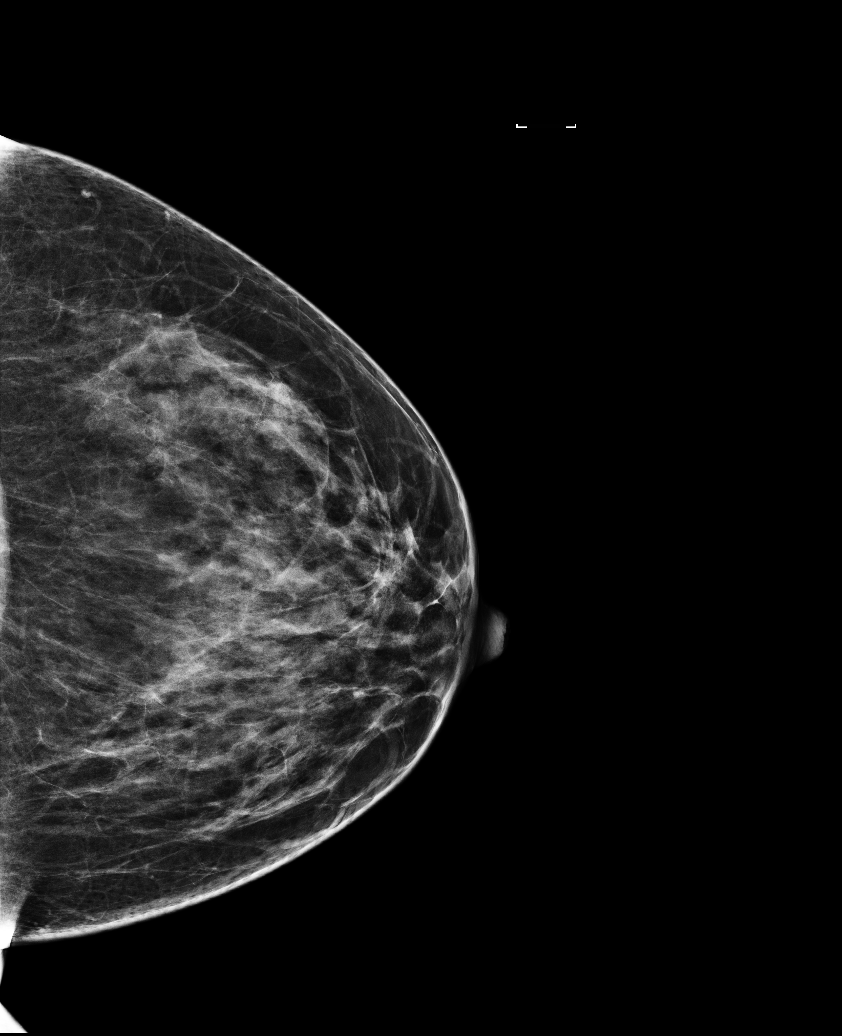

[5 of 5 positions shown; findings below may reference images not displayed]

ACR Breast Density Category c: The breast tissue is heterogeneously
dense, which may obscure small masses.
FINDINGS: There are no findings suspicious for malignancy. Images were
processed with CAD.
IMPRESSION: No mammographic evidence of malignancy. A result letter of this
screening mammogram will be mailed directly to the patient.

RECOMMENDATION:
Screening mammogram in one year. (Code:[id number]-[id number])

BI-RADS CATEGORY  1: Negative.

## 2021-09-05 DIAGNOSIS — H524 Presbyopia: Secondary | ICD-10-CM | POA: Diagnosis not present

## 2021-09-05 DIAGNOSIS — H25813 Combined forms of age-related cataract, bilateral: Secondary | ICD-10-CM | POA: Diagnosis not present

## 2021-09-05 DIAGNOSIS — H04123 Dry eye syndrome of bilateral lacrimal glands: Secondary | ICD-10-CM | POA: Diagnosis not present

## 2022-03-14 ENCOUNTER — Other Ambulatory Visit: Payer: Self-pay | Admitting: Obstetrics and Gynecology

## 2022-03-14 DIAGNOSIS — Z1231 Encounter for screening mammogram for malignant neoplasm of breast: Secondary | ICD-10-CM

## 2022-04-06 ENCOUNTER — Ambulatory Visit
Admission: RE | Admit: 2022-04-06 | Discharge: 2022-04-06 | Disposition: A | Discharge status: Home / Self Care | Payer: 59 | Source: Ambulatory Visit | Attending: Obstetrics and Gynecology | Admitting: Obstetrics and Gynecology

## 2022-04-06 DIAGNOSIS — Z1231 Encounter for screening mammogram for malignant neoplasm of breast: Secondary | ICD-10-CM

## 2022-04-06 DIAGNOSIS — Z01419 Encounter for gynecological examination (general) (routine) without abnormal findings: Secondary | ICD-10-CM | POA: Diagnosis not present

## 2022-04-06 DIAGNOSIS — N841 Polyp of cervix uteri: Secondary | ICD-10-CM | POA: Diagnosis not present

## 2022-04-06 DIAGNOSIS — N9489 Other specified conditions associated with female genital organs and menstrual cycle: Secondary | ICD-10-CM | POA: Diagnosis not present

## 2022-04-06 DIAGNOSIS — Z780 Asymptomatic menopausal state: Secondary | ICD-10-CM | POA: Diagnosis not present

## 2022-04-06 DIAGNOSIS — N750 Cyst of Bartholin's gland: Secondary | ICD-10-CM | POA: Diagnosis not present

## 2022-04-11 LAB — HM PAP SMEAR: HPV, high-risk: NEGATIVE

## 2022-07-04 DIAGNOSIS — D251 Intramural leiomyoma of uterus: Secondary | ICD-10-CM | POA: Diagnosis not present

## 2022-07-04 DIAGNOSIS — N9489 Other specified conditions associated with female genital organs and menstrual cycle: Secondary | ICD-10-CM | POA: Diagnosis not present

## 2023-07-16 DIAGNOSIS — D251 Intramural leiomyoma of uterus: Secondary | ICD-10-CM | POA: Diagnosis not present

## 2023-07-16 DIAGNOSIS — Z780 Asymptomatic menopausal state: Secondary | ICD-10-CM | POA: Diagnosis not present

## 2023-07-16 DIAGNOSIS — Z01419 Encounter for gynecological examination (general) (routine) without abnormal findings: Secondary | ICD-10-CM | POA: Diagnosis not present

## 2024-01-02 ENCOUNTER — Other Ambulatory Visit: Payer: Self-pay | Admitting: Obstetrics and Gynecology

## 2024-01-02 DIAGNOSIS — Z1231 Encounter for screening mammogram for malignant neoplasm of breast: Secondary | ICD-10-CM

## 2024-03-11 ENCOUNTER — Ambulatory Visit
Admission: RE | Admit: 2024-03-11 | Discharge: 2024-03-11 | Disposition: A | Discharge status: Home / Self Care | Source: Ambulatory Visit | Attending: Obstetrics and Gynecology | Admitting: Obstetrics and Gynecology

## 2024-03-11 DIAGNOSIS — Z1231 Encounter for screening mammogram for malignant neoplasm of breast: Secondary | ICD-10-CM

## 2024-03-11 DIAGNOSIS — H524 Presbyopia: Secondary | ICD-10-CM | POA: Diagnosis not present

## 2024-03-11 LAB — HM MAMMOGRAPHY

## 2024-04-16 ENCOUNTER — Encounter: Payer: Self-pay | Admitting: Internal Medicine

## 2024-04-16 ENCOUNTER — Ambulatory Visit: Admitting: Internal Medicine

## 2024-04-16 VITALS — BP 150/94 | HR 100 | Temp 98.1°F | Ht 64.0 in | Wt 181.8 lb

## 2024-04-16 DIAGNOSIS — Z0000 Encounter for general adult medical examination without abnormal findings: Secondary | ICD-10-CM

## 2024-04-16 DIAGNOSIS — R1310 Dysphagia, unspecified: Secondary | ICD-10-CM

## 2024-04-16 DIAGNOSIS — Z1159 Encounter for screening for other viral diseases: Secondary | ICD-10-CM | POA: Diagnosis not present

## 2024-04-16 DIAGNOSIS — Z114 Encounter for screening for human immunodeficiency virus [HIV]: Secondary | ICD-10-CM

## 2024-04-16 DIAGNOSIS — Z1211 Encounter for screening for malignant neoplasm of colon: Secondary | ICD-10-CM

## 2024-04-16 DIAGNOSIS — R0609 Other forms of dyspnea: Secondary | ICD-10-CM | POA: Diagnosis not present

## 2024-04-16 DIAGNOSIS — R030 Elevated blood-pressure reading, without diagnosis of hypertension: Secondary | ICD-10-CM | POA: Diagnosis not present

## 2024-04-16 DIAGNOSIS — K219 Gastro-esophageal reflux disease without esophagitis: Secondary | ICD-10-CM | POA: Diagnosis not present

## 2024-04-16 DIAGNOSIS — Z23 Encounter for immunization: Secondary | ICD-10-CM | POA: Diagnosis not present

## 2024-04-16 LAB — COMPREHENSIVE METABOLIC PANEL WITH GFR
ALT: 15 U/L (ref 0–35)
AST: 24 U/L (ref 0–37)
Albumin: 4.7 g/dL (ref 3.5–5.2)
Alkaline Phosphatase: 53 U/L (ref 39–117)
BUN: 16 mg/dL (ref 6–23)
CO2: 31 meq/L (ref 19–32)
Calcium: 10.3 mg/dL (ref 8.4–10.5)
Chloride: 103 meq/L (ref 96–112)
Creatinine, Ser: 1.07 mg/dL (ref 0.40–1.20)
GFR: 56.81 mL/min — ABNORMAL LOW (ref >60.00)
Glucose, Bld: 94 mg/dL (ref 70–99)
Potassium: 3.7 meq/L (ref 3.5–5.1)
Sodium: 141 meq/L (ref 135–145)
Total Bilirubin: 0.5 mg/dL (ref 0.2–1.2)
Total Protein: 7.9 g/dL (ref 6.0–8.3)

## 2024-04-16 LAB — CBC WITH DIFFERENTIAL/PLATELET
Basophils Absolute: 0 K/uL (ref 0.0–0.1)
Basophils Relative: 0.6 % (ref 0.0–3.0)
Eosinophils Absolute: 0.1 K/uL (ref 0.0–0.7)
Eosinophils Relative: 1.8 % (ref 0.0–5.0)
HCT: 40.2 % (ref 36.0–46.0)
Hemoglobin: 12.7 g/dL (ref 12.0–15.0)
Lymphocytes Relative: 25.2 % (ref 12.0–46.0)
Lymphs Abs: 1.1 K/uL (ref 0.7–4.0)
MCHC: 31.7 g/dL (ref 30.0–36.0)
MCV: 82.8 fl (ref 78.0–100.0)
Monocytes Absolute: 0.3 K/uL (ref 0.1–1.0)
Monocytes Relative: 6.1 % (ref 3.0–12.0)
Neutro Abs: 2.8 K/uL (ref 1.4–7.7)
Neutrophils Relative %: 66.3 % (ref 43.0–77.0)
Platelets: 212 K/uL (ref 150.0–400.0)
RBC: 4.86 Mil/uL (ref 3.87–5.11)
RDW: 13.1 % (ref 11.5–15.5)
WBC: 4.2 K/uL (ref 4.0–10.5)

## 2024-04-16 LAB — LIPID PANEL
Cholesterol: 211 mg/dL — ABNORMAL HIGH (ref 0–200)
HDL: 52.1 mg/dL (ref >39.00)
LDL Cholesterol: 143 mg/dL — ABNORMAL HIGH (ref 0–99)
NonHDL: 159.06
Total CHOL/HDL Ratio: 4
Triglycerides: 79 mg/dL (ref 0.0–149.0)
VLDL: 15.8 mg/dL (ref 0.0–40.0)

## 2024-04-16 LAB — HEMOGLOBIN A1C: Hgb A1c MFr Bld: 5.5 % (ref 4.6–6.5)

## 2024-04-16 NOTE — Addendum Note (Signed)
 Addended by: KATHRYNE MILLMAN B on: 04/16/2024 04:32 PM   Modules accepted: Orders

## 2024-04-16 NOTE — Progress Notes (Signed)
 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 Past Medical History is significant for: Mild obesity, reported whitecoat syndrome.  She has been having some episodes of dysphagia.  Has a history of GERD.  No smoking or drinking, no allergies, past surgical history significant for C-section, family history significant for father who passed away after a CVA and a mother who had diabetes, hyperlipidemia and hypothyroidism.  She is due for PCV 20 and possibly Tdap.  Has never had a screening colonoscopy.  Follows with GYN, Dr. Rutherford, mammogram is up-to-date.   Past Medical/Surgical History: Past Medical History:  Diagnosis Date   Allergic rhinitis    GERD (gastroesophageal reflux disease)     Past Surgical History:  Procedure Laterality Date   CESAREAN SECTION      Social History:  reports that she has never smoked. She does not have any smokeless tobacco history on file. She reports that she does not drink alcohol and does not use drugs.  Allergies: No Known Allergies  Family History:  Family History  Problem Relation Age of Onset   Diabetes Mother    Hyperlipidemia Mother    Hypertension Father    Mental illness Sister    Diabetes Maternal Grandmother    Stroke Maternal Grandmother    Diabetes Maternal Grandfather    Diabetes Sister      Current Outpatient Medications:    albuterol  (VENTOLIN  HFA) 108 (90 Base) MCG/ACT inhaler, Inhale 2 puffs into the lungs., Disp: , Rfl:    calcium carbonate (SUPER CALCIUM) 1500 (600 Ca) MG TABS tablet, Take by mouth., Disp: , Rfl:    famotidine (PEPCID) 40 MG tablet, Take by mouth. (Patient not taking: Reported on 04/16/2024), Disp: , Rfl:    Ferrous Sulfate (IRON PO), Take by mouth. (Patient not taking: Reported on 04/16/2024), Disp: , Rfl:   Review  of Systems:  Negative except as indicated in HPI.   Physical Exam: Vitals:   04/16/24 0920 04/16/24 0922  BP: (!) 160/80 (!) 150/94  Pulse: 100   Temp: 98.1 F (36.7 C)   TempSrc: Oral   SpO2: 98%   Weight: 181 lb 12.8 oz (82.5 kg)   Height: 5' 4 (1.626 m)    Body mass index is 31.21 kg/m.  Physical Exam Vitals reviewed.  Constitutional:      General: She is not in acute distress.    Appearance: Normal appearance. She is obese. She is not ill-appearing, toxic-appearing or diaphoretic.  HENT:     Head: Normocephalic.     Right Ear: Tympanic membrane, ear canal and external ear normal. There is no impacted cerumen.     Left Ear: Tympanic membrane, ear canal and external ear normal. There is no impacted cerumen.     Nose: Nose normal.     Mouth/Throat:     Mouth: Mucous membranes are moist.     Pharynx: Oropharynx is clear. No oropharyngeal exudate or posterior oropharyngeal erythema.  Eyes:     General: No scleral icterus.       Right eye: No discharge.        Left eye: No discharge.     Conjunctiva/sclera: Conjunctivae normal.  Neck:     Vascular: No carotid bruit.  Cardiovascular:     Rate and Rhythm: Normal rate and regular  rhythm.     Pulses: Normal pulses.     Heart sounds: Normal heart sounds.  Pulmonary:     Effort: Pulmonary effort is normal. No respiratory distress.     Breath sounds: Normal breath sounds.  Abdominal:     General: Abdomen is flat. Bowel sounds are normal.     Palpations: Abdomen is soft.  Musculoskeletal:        General: Normal range of motion.     Cervical back: Normal range of motion.  Skin:    General: Skin is warm and dry.  Neurological:     General: No focal deficit present.     Mental Status: She is alert and oriented to person, place, and time. Mental status is at baseline.  Psychiatric:        Mood and Affect: Mood normal.        Behavior: Behavior normal.        Thought Content: Thought content normal.        Judgment:  Judgment normal.        Impression and Plan:  Screening for malignant neoplasm of colon -     Ambulatory referral to Gastroenterology  Gastroesophageal reflux disease, unspecified whether esophagitis present -     CBC with Differential/Platelet; Future -     Comprehensive metabolic panel with GFR; Future -     Hemoglobin A1c; Future -     Lipid panel; Future -     TSH; Future -     Vitamin B12; Future -     VITAMIN D 25 Hydroxy (Vit-D Deficiency, Fractures); Future  Encounter for hepatitis C screening test for low risk patient -     Hepatitis C antibody; Future  Encounter for screening for HIV -     HIV Antibody (routine testing w rflx); Future  Immunization due  Screening for colon cancer  Dysphagia, unspecified type  DOE (dyspnea on exertion) -     Ambulatory referral to Cardiology  Elevated BP reading w/ no diagnosis of HTN    -Recommend routine eye and dental care. -Healthy lifestyle discussed in detail. -Labs to be updated today. -Prostate cancer screening: Not applicable Health Maintenance  Topic Date Due   HIV Screening  Never done   Hepatitis C Screening  Never done   DTaP/Tdap/Td vaccine (1 - Tdap) Never done   Hepatitis B Vaccine (1 of 3 - 19+ 3-dose series) Never done   Pap with HPV screening  Never done   Colon Cancer Screening  Never done   Pneumococcal Vaccine for age over 109 (1 of 1 - PCV) Never done   COVID-19 Vaccine (5 - 2025-26 season) 02/11/2024   Breast Cancer Screening  03/11/2026   Flu Shot  Completed   Zoster (Shingles) Vaccine  Completed   HPV Vaccine  Aged Out   Meningitis B Vaccine  Aged Out     -PCV 20 in office today, will obtain Tdap vaccination records to see if due for update. - Refer to GI for screening colonoscopy also for dysphagia and GERD, question need for upper endoscopy. - Referred to cardiology per request for cardiac workup, asymptomatic. - Blood pressure noted to be elevated today on 2 separate measurements.   She will do ambulatory blood pressure measurements and follow-up with me in 3 to 4 months.       Tully Theophilus Andrews, MD [facility]

## 2024-04-17 ENCOUNTER — Ambulatory Visit: Payer: Self-pay | Admitting: Internal Medicine

## 2024-04-17 DIAGNOSIS — E782 Mixed hyperlipidemia: Secondary | ICD-10-CM

## 2024-04-17 LAB — HIV ANTIBODY (ROUTINE TESTING W REFLEX)
HIV 1&2 Ab, 4th Generation: NONREACTIVE
HIV FINAL INTERPRETATION: NEGATIVE

## 2024-04-17 LAB — VITAMIN D 25 HYDROXY (VIT D DEFICIENCY, FRACTURES): VITD: 38.15 ng/mL (ref 30.00–100.00)

## 2024-04-17 LAB — TSH: TSH: 1.64 u[IU]/mL (ref 0.35–5.50)

## 2024-04-17 LAB — HEPATITIS C ANTIBODY: Hepatitis C Ab: NONREACTIVE

## 2024-04-17 LAB — VITAMIN B12: Vitamin B-12: 398 pg/mL (ref 211–911)

## 2024-04-22 ENCOUNTER — Telehealth: Payer: Self-pay | Admitting: *Deleted

## 2024-04-22 ENCOUNTER — Encounter: Payer: Self-pay | Admitting: Internal Medicine

## 2024-04-22 DIAGNOSIS — E782 Mixed hyperlipidemia: Secondary | ICD-10-CM

## 2024-04-22 DIAGNOSIS — R944 Abnormal results of kidney function studies: Secondary | ICD-10-CM

## 2024-04-22 NOTE — Telephone Encounter (Signed)
Noted  Labs ordered

## 2024-04-22 NOTE — Telephone Encounter (Signed)
 Copied from CRM [id number]. Topic: Clinical - Lab/Test Results >> Apr 22, 2024  8:45 AM Christie Werner wrote: Reason for CRM: Patient called for lab results, I provided information, patient understood.

## 2024-06-23 ENCOUNTER — Encounter: Payer: Self-pay | Admitting: Internal Medicine

## 2024-06-23 ENCOUNTER — Other Ambulatory Visit (HOSPITAL_BASED_OUTPATIENT_CLINIC_OR_DEPARTMENT_OTHER): Payer: Self-pay

## 2024-06-23 ENCOUNTER — Encounter: Payer: Self-pay | Admitting: Cardiology

## 2024-06-23 ENCOUNTER — Ambulatory Visit: Attending: Cardiology | Admitting: Cardiology

## 2024-06-23 ENCOUNTER — Ambulatory Visit: Admitting: Internal Medicine

## 2024-06-23 VITALS — BP 142/80 | HR 81 | Ht 64.0 in | Wt 183.0 lb

## 2024-06-23 VITALS — BP 120/84 | HR 74 | Temp 98.2°F | Wt 179.5 lb

## 2024-06-23 DIAGNOSIS — R0609 Other forms of dyspnea: Secondary | ICD-10-CM | POA: Diagnosis not present

## 2024-06-23 DIAGNOSIS — I10 Essential (primary) hypertension: Secondary | ICD-10-CM

## 2024-06-23 DIAGNOSIS — E782 Mixed hyperlipidemia: Secondary | ICD-10-CM | POA: Diagnosis not present

## 2024-06-23 MED ORDER — AMLODIPINE BESYLATE 5 MG PO TABS
5.0000 mg | ORAL_TABLET | Freq: Every day | ORAL | 1 refills | Status: AC
  Filled 2024-06-23: qty 90, 90d supply, fill #0

## 2024-06-23 NOTE — Progress Notes (Signed)
 "     Clinical Summary Christie Werner is a 59 y.o.female seen today as a new consult, referred by Dr Marliss Andrews for the following medical problems    1.DOE - no formal diagnosis of asthma or COPD, but has prn albuterol  - with colds cough, wheezing. Will use albuterol  and symptoms improve - walks up a flight of stairs without exertion, does house work (vacuuming, sweeping, mopping) - no edema, no orthopnea. No chest pains.   CAD risk factors: Just recentl started norvasc  for HTN. LDL 143, working on dietary changes. Never smoked.      2. 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     amLODipine  (NORVASC ) 5 MG tablet Take 1 tablet (5 mg total) by mouth daily. 90 tablet 1   calcium carbonate (SUPER CALCIUM) 1500 (600 Ca) MG TABS tablet Take by mouth.     famotidine (PEPCID) 40 MG tablet Take by mouth. (Patient not taking: Reported on 06/23/2024)     No current facility-administered medications for this visit.     Past Surgical History:  Procedure Laterality Date   CESAREAN SECTION       Allergies[2]    Family History  Problem Relation Age of Onset   Diabetes Mother    Hyperlipidemia Mother    Hypertension Father    Mental illness Sister    Diabetes Sister    Diabetes Maternal Grandmother    Stroke Maternal Grandmother    Diabetes Maternal Grandfather      Social History Ms. Eshleman reports that she has never smoked. She does not have any smokeless tobacco history on file. Ms. Martus reports no history of alcohol use.    Physical Examination Today's Vitals   06/23/24 1504  06/23/24 1549  BP: (!) 140/84 (!) 142/80  Pulse: 81   Weight: 183 lb (83 kg)   Height: 5' 4 (1.626 m)    Body mass index is 31.41 kg/m.  Gen: resting comfortably, no acute distress HEENT: no scleral icterus, pupils equal round and reactive, no palptable cervical adenopathy,  CV: RRR, no m/rg, no jvd Resp: Clear to auscultation bilaterally GI: abdomen is soft, non-tender, non-distended, normal bowel sounds, no hepatosplenomegaly MSK: extremities are warm, no edema.  Skin: warm, no rash Neuro:  no focal deficits Psych: appropriate affect      Assessment and Plan   1.DOE -symptoms only occur in setting of respiratory illness - at baseline she denies any regular exertional symptoms. No chest pains, no signs of fluid overload - no indication for additional cardiac testing at this time  2. HTN - to start norvasc  per pcp which would agree with as initial agent - follow bp's  3. HLD - follow up repeat labs with dietary modifications.  ASVCD risk score is 9%, intermediate risk - patient working on dietary modification with plans to repeat lipid panel - pending results and depending on her openess for statin, could consider coronary calcium score.   EKG today ectopic atrial rhythm, this is a benign finding.   F/u as needed  Dorn PHEBE Ross, M.D.     [  1] No Known Allergies [2] No Known Allergies  "

## 2024-06-23 NOTE — Progress Notes (Signed)
 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 Past Medical History is significant for: Elevated blood pressure was not yet been diagnosed with hypertension.  Here today to follow-up on this.  She was also noted on recent labs to have a GFR of 57 and a cholesterol with an LDL of 143.  Ambulatory systolic blood pressures in the 130s to upper 140s with diastolics in the high 70s to high 80s.   Past Medical/Surgical History: Past Medical History:  Diagnosis Date   Allergic rhinitis    GERD (gastroesophageal reflux disease)     Past Surgical History:  Procedure Laterality Date   CESAREAN SECTION      Social History:  reports that she has never smoked. She does not have any smokeless tobacco history on file. She reports that she does not drink alcohol and does not use drugs.  Allergies: Allergies[1]  Family History:  Family History  Problem Relation Age of Onset   Diabetes Mother    Hyperlipidemia Mother    Hypertension Father    Mental illness Sister    Diabetes Maternal Grandmother    Stroke Maternal Grandmother    Diabetes Maternal Grandfather    Diabetes Sister     Current Medications[2]  Review of Systems:  Negative unless indicated in HPI.   Physical Exam: Vitals:   06/23/24 1030  BP: 120/84  Pulse: 74  Temp: 98.2 F (36.8 C)  TempSrc: Oral  SpO2: 100%  Weight: 179 lb 8 oz (81.4 kg)    Body mass index is 30.81 kg/m.   Physical Exam Vitals reviewed.  Constitutional:      Appearance: Normal appearance.  HENT:     Head: Normocephalic and atraumatic.  Cardiovascular:     Rate and Rhythm: Normal rate and regular rhythm.  Pulmonary:     Effort: Pulmonary effort is normal.     Breath sounds: Normal breath sounds.  Skin:    General: Skin is warm and dry.  Neurological:     General: No focal deficit  present.     Mental Status: She is alert and oriented to person, place, and time.  Psychiatric:        Mood and Affect: Mood normal.        Behavior: Behavior normal.        Thought Content: Thought content normal.        Judgment: Judgment normal.      Impression and Plan:  Primary hypertension -     Lipid panel; Future -     Basic metabolic panel with GFR; Future -     amLODIPine  Besylate; Take 1 tablet (5 mg total) by mouth daily.  Dispense: 90 tablet; Refill: 1  Mixed hyperlipidemia -     Lipid panel; Future   - Recheck lipids and GFR. - I will go ahead and make a diagnosis of hypertension and start amlodipine  5 mg daily.  She will return in 3 months for follow-up.  Time spent:30 minutes reviewing chart, interviewing and examining patient and formulating plan of care.     Tully Theophilus Andrews, MD [facility]     [1] No Known Allergies [2]  Current Outpatient Medications:    albuterol  (VENTOLIN  HFA) 108 (90 Base) MCG/ACT inhaler, Inhale 2 puffs into the lungs., Disp: , Rfl:  calcium carbonate (SUPER CALCIUM) 1500 (600 Ca) MG TABS tablet, Take by mouth., Disp: , Rfl:    amLODipine  (NORVASC ) 5 MG tablet, Take 1 tablet (5 mg total) by mouth daily., Disp: 90 tablet, Rfl: 1   famotidine (PEPCID) 40 MG tablet, Take by mouth. (Patient not taking: Reported on 04/16/2024), Disp: , Rfl:    Ferrous Sulfate (IRON PO), Take by mouth. (Patient not taking: Reported on 04/16/2024), Disp: , Rfl:   "

## 2024-06-23 NOTE — Patient Instructions (Signed)
Medication Instructions:  ?Your physician recommends that you continue on your current medications as directed. Please refer to the Current Medication list given to you today. ? ?Labwork: ?None today ? ?Testing/Procedures: ?None today ? ?Follow-Up: ?As needed with Dr.Branch ? ?Any Other Special Instructions Will Be Listed Below (If Applicable). ? ?If you need a refill on your cardiac medications before your next appointment, please call your pharmacy. ? ?

## 2024-06-24 ENCOUNTER — Other Ambulatory Visit (HOSPITAL_COMMUNITY): Payer: Self-pay

## 2024-06-30 ENCOUNTER — Encounter (INDEPENDENT_AMBULATORY_CARE_PROVIDER_SITE_OTHER): Payer: Self-pay | Admitting: *Deleted

## 2024-07-25 ENCOUNTER — Other Ambulatory Visit

## 2024-09-22 ENCOUNTER — Ambulatory Visit: Admitting: Internal Medicine

## 2024-09-29 ENCOUNTER — Ambulatory Visit: Admitting: Internal Medicine
